# Patient Record
Sex: Female | Born: 1980 | Hispanic: Yes | Marital: Single | Smoking: Never smoker
Health system: Southern US, Community
[De-identification: ages and names within clinical notes are randomized; demographics above are authoritative.]

## PROBLEM LIST (undated history)

## (undated) DIAGNOSIS — Z789 Other specified health status: Secondary | ICD-10-CM

## (undated) HISTORY — PX: ABDOMINAL HYSTERECTOMY: SHX81

---

## 2011-05-16 ENCOUNTER — Emergency Department (HOSPITAL_COMMUNITY): Payer: Self-pay

## 2011-05-16 ENCOUNTER — Encounter (HOSPITAL_COMMUNITY): Payer: Self-pay | Admitting: Cardiology

## 2011-05-16 ENCOUNTER — Observation Stay (HOSPITAL_COMMUNITY)
Admission: EM | Admit: 2011-05-16 | Discharge: 2011-05-17 | Disposition: A | Discharge status: Home / Self Care | Payer: No Typology Code available for payment source | Attending: General Surgery | Admitting: General Surgery

## 2011-05-16 DIAGNOSIS — S060X0A Concussion without loss of consciousness, initial encounter: Secondary | ICD-10-CM | POA: Diagnosis present

## 2011-05-16 DIAGNOSIS — S0003XA Contusion of scalp, initial encounter: Secondary | ICD-10-CM

## 2011-05-16 DIAGNOSIS — S7000XA Contusion of unspecified hip, initial encounter: Secondary | ICD-10-CM | POA: Insufficient documentation

## 2011-05-16 DIAGNOSIS — S0083XA Contusion of other part of head, initial encounter: Secondary | ICD-10-CM

## 2011-05-16 DIAGNOSIS — S300XXA Contusion of lower back and pelvis, initial encounter: Secondary | ICD-10-CM

## 2011-05-16 DIAGNOSIS — R413 Other amnesia: Secondary | ICD-10-CM | POA: Insufficient documentation

## 2011-05-16 DIAGNOSIS — D649 Anemia, unspecified: Secondary | ICD-10-CM | POA: Insufficient documentation

## 2011-05-16 DIAGNOSIS — S060X9A Concussion with loss of consciousness of unspecified duration, initial encounter: Secondary | ICD-10-CM

## 2011-05-16 DIAGNOSIS — S7001XA Contusion of right hip, initial encounter: Secondary | ICD-10-CM | POA: Diagnosis present

## 2011-05-16 DIAGNOSIS — S060XAA Concussion with loss of consciousness status unknown, initial encounter: Secondary | ICD-10-CM

## 2011-05-16 DIAGNOSIS — Z23 Encounter for immunization: Secondary | ICD-10-CM | POA: Insufficient documentation

## 2011-05-16 DIAGNOSIS — Y998 Other external cause status: Secondary | ICD-10-CM | POA: Insufficient documentation

## 2011-05-16 DIAGNOSIS — S1093XA Contusion of unspecified part of neck, initial encounter: Secondary | ICD-10-CM

## 2011-05-16 DIAGNOSIS — T07XXXA Unspecified multiple injuries, initial encounter: Secondary | ICD-10-CM

## 2011-05-16 HISTORY — DX: Other specified health status: Z78.9

## 2011-05-16 LAB — DIFFERENTIAL
Basophils Relative: 0 % (ref 0–1)
Eosinophils Absolute: 0.1 10*3/uL (ref 0.0–0.7)
Monocytes Absolute: 0.6 10*3/uL (ref 0.1–1.0)
Monocytes Relative: 7 % (ref 3–12)
Neutrophils Relative %: 47 % (ref 43–77)

## 2011-05-16 LAB — CBC
HCT: 39 % (ref 36.0–46.0)
Hemoglobin: 12.5 g/dL (ref 12.0–15.0)
MCH: 25.3 pg — ABNORMAL LOW (ref 26.0–34.0)
MCHC: 32.1 g/dL (ref 30.0–36.0)

## 2011-05-16 LAB — POCT I-STAT, CHEM 8
Calcium, Ion: 1.13 mmol/L (ref 1.12–1.32)
Glucose, Bld: 89 mg/dL (ref 70–99)
HCT: 41 % (ref 36.0–46.0)
Hemoglobin: 13.9 g/dL (ref 12.0–15.0)

## 2011-05-16 LAB — BASIC METABOLIC PANEL
BUN: 8 mg/dL (ref 6–23)
Calcium: 9.4 mg/dL (ref 8.4–10.5)
Creatinine, Ser: 0.67 mg/dL (ref 0.50–1.10)
GFR calc Af Amer: >90 mL/min (ref >90)
GFR calc non Af Amer: >90 mL/min (ref >90)

## 2011-05-16 LAB — URINALYSIS, ROUTINE W REFLEX MICROSCOPIC
Bilirubin Urine: NEGATIVE
Ketones, ur: 40 mg/dL — AB
Nitrite: NEGATIVE
Urobilinogen, UA: 0.2 mg/dL (ref 0.0–1.0)

## 2011-05-16 MED ORDER — SODIUM CHLORIDE 0.9 % IV SOLN
INTRAVENOUS | Status: DC
  Administered 2011-05-16 – 2011-05-17 (×2): via INTRAVENOUS
  Filled 2011-05-16 (×4): qty 1000

## 2011-05-16 MED ORDER — HYDROCODONE-ACETAMINOPHEN 5-325 MG PO TABS
2.0000 | ORAL_TABLET | ORAL | Status: DC | PRN
  Administered 2011-05-16 – 2011-05-17 (×2): 2 via ORAL
  Filled 2011-05-16 (×2): qty 2

## 2011-05-16 MED ORDER — KETOROLAC TROMETHAMINE 15 MG/ML IJ SOLN
30.0000 mg | INTRAMUSCULAR | Status: AC
  Filled 2011-05-16: qty 2

## 2011-05-16 MED ORDER — MORPHINE SULFATE 4 MG/ML IJ SOLN
4.0000 mg | Freq: Once | INTRAMUSCULAR | Status: AC
  Administered 2011-05-16: 4 mg via INTRAVENOUS

## 2011-05-16 MED ORDER — ONDANSETRON HCL 4 MG/2ML IJ SOLN
4.0000 mg | Freq: Once | INTRAMUSCULAR | Status: AC
  Administered 2011-05-16: 4 mg via INTRAVENOUS
  Filled 2011-05-16: qty 2

## 2011-05-16 MED ORDER — ONDANSETRON HCL 8 MG PO TABS
4.0000 mg | ORAL_TABLET | Freq: Four times a day (QID) | ORAL | Status: DC | PRN
  Filled 2011-05-16: qty 0.5

## 2011-05-16 MED ORDER — ONDANSETRON HCL 4 MG/2ML IJ SOLN
4.0000 mg | Freq: Four times a day (QID) | INTRAMUSCULAR | Status: DC | PRN
  Administered 2011-05-16: 4 mg via INTRAVENOUS

## 2011-05-16 MED ORDER — ACETAMINOPHEN 325 MG PO TABS: 650.0000 mg | ORAL_TABLET | ORAL | Status: DC | PRN

## 2011-05-16 MED ORDER — HYDROMORPHONE HCL PF 1 MG/ML IJ SOLN
1.0000 mg | Freq: Once | INTRAMUSCULAR | Status: AC
  Administered 2011-05-16: 1 mg via INTRAVENOUS
  Filled 2011-05-16: qty 1

## 2011-05-16 MED ORDER — SODIUM CHLORIDE 0.9 % IV SOLN
Freq: Once | INTRAVENOUS | Status: AC
  Administered 2011-05-16: 15:00:00 via INTRAVENOUS

## 2011-05-16 MED ORDER — MORPHINE SULFATE 2 MG/ML IJ SOLN
INTRAMUSCULAR | Status: AC | PRN
  Administered 2011-05-16: 4 mg via INTRAVENOUS

## 2011-05-16 MED ORDER — ONDANSETRON HCL 4 MG/2ML IJ SOLN
4.0000 mg | Freq: Once | INTRAMUSCULAR | Status: AC
  Administered 2011-05-16: 4 mg via INTRAVENOUS

## 2011-05-16 MED ORDER — MORPHINE SULFATE 4 MG/ML IJ SOLN
4.0000 mg | Freq: Once | INTRAMUSCULAR | Status: AC
  Administered 2011-05-16: 4 mg via INTRAVENOUS
  Filled 2011-05-16: qty 1

## 2011-05-16 MED ORDER — KETOROLAC TROMETHAMINE 15 MG/ML IJ SOLN
15.0000 mg | Freq: Four times a day (QID) | INTRAMUSCULAR | Status: DC | PRN
  Filled 2011-05-16: qty 1

## 2011-05-16 MED ORDER — FENTANYL CITRATE 0.05 MG/ML IJ SOLN: 25.0000 ug | INTRAMUSCULAR | Status: DC | PRN
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
## 2011-05-16 NOTE — ED Notes (Signed)
Family updated as to patient's status. Friend/co-worker was informed of pt's status, he is en route to our facility, Immunologist made calls and updated pt. There is no contact number for family at this time, pt does not remember any phone numbers and cell phone was not with pt upon arrival to ED

## 2011-05-16 NOTE — ED Notes (Signed)
Pt reports she is unable to give a ua at this time

## 2011-05-16 NOTE — ED Notes (Signed)
Pt transported to CT w/Murrell Elizondo RN
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
## 2011-05-16 NOTE — ED Notes (Signed)
Pt undressed and in a gown. Cardiac monitor, pulse oximetry, and blood pressure cuff on. 

## 2011-05-16 NOTE — ED Notes (Signed)
[id number] Ready 

## 2011-05-16 NOTE — ED Notes (Signed)
Pt still unable to obtain a UA for [place] at this time.  Pt extremely dizzy
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
## 2011-05-16 NOTE — ED Notes (Signed)
Pt returned from CT/XR 

## 2011-05-16 NOTE — Progress Notes (Signed)
Pt had a BP of 89/61 at 1900, Trauma MD notified, patient's rate of fluid changed to 125cc/hr. Overall pt is stable.

## 2011-05-17 ENCOUNTER — Observation Stay (HOSPITAL_COMMUNITY): Payer: No Typology Code available for payment source

## 2011-05-17 ENCOUNTER — Encounter (HOSPITAL_COMMUNITY): Payer: Self-pay | Admitting: Radiology

## 2011-05-17 LAB — CBC
HCT: 33.9 % — ABNORMAL LOW (ref 36.0–46.0)
Hemoglobin: 10.9 g/dL — ABNORMAL LOW (ref 12.0–15.0)
MCHC: 32.2 g/dL (ref 30.0–36.0)
RBC: 4.29 MIL/uL (ref 3.87–5.11)
WBC: 13 10*3/uL — ABNORMAL HIGH (ref 4.0–10.5)

## 2011-05-17 LAB — BASIC METABOLIC PANEL
BUN: 5 mg/dL — ABNORMAL LOW (ref 6–23)
Chloride: 107 mEq/L (ref 96–112)
GFR calc Af Amer: >90 mL/min (ref >90)
GFR calc non Af Amer: >90 mL/min (ref >90)
Potassium: 3.2 mEq/L — ABNORMAL LOW (ref 3.5–5.1)
Sodium: 138 mEq/L (ref 135–145)

## 2011-05-17 MED ORDER — INFLUENZA VIRUS VACC SPLIT PF IM SUSP
0.5000 mL | Freq: Once | INTRAMUSCULAR | Status: AC
  Administered 2011-05-17: 0.5 mL via INTRAMUSCULAR
  Filled 2011-05-17: qty 0.5

## 2011-05-17 MED ORDER — IOHEXOL 300 MG/ML  SOLN
100.0000 mL | Freq: Once | INTRAMUSCULAR | Status: AC | PRN
  Administered 2011-05-17: 100 mL via INTRAVENOUS

## 2011-05-17 MED ORDER — ACETAMINOPHEN 325 MG PO TABS: 650.0000 mg | ORAL_TABLET | ORAL | Status: AC | PRN

## 2011-05-17 MED ORDER — HYDROCODONE-ACETAMINOPHEN 5-325 MG PO TABS: 2.0000 | ORAL_TABLET | ORAL | Status: AC | PRN
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
## 2011-05-17 NOTE — Progress Notes (Signed)
Paged MD (rosenbower) concerning BP of 83/47. Orders to get a CBC. Will continue to monitor
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
## 2011-05-17 NOTE — Progress Notes (Signed)
UR of chart completed. Await PT/OT evals to determine if pt will have HH Needs.

## 2011-05-17 NOTE — Plan of Care (Signed)
Problem: Phase II Progression Outcomes Goal: Other Phase II Outcomes/Goals Cognitive assessment completed.  No ST services indicated.  See report for details.  Breck Coons [place].Ed [employer] [phone]  05/17/2011 \

## 2011-05-17 NOTE — Discharge Instructions (Signed)
Concussion and Brain Injury     A blow or jolt to the head can disrupt the normal function of the brain. This type of brain injury is often called a "concussion" or a "closed head injury." Concussions are usually not life-threatening. Even so, the effects of a concussion can be serious.   CAUSES   A concussion is caused by a blunt blow to the head. The blow might be direct or indirect as described below.  · Direct blow (running into another player during a soccer game, being hit in a fight, or hitting your head on a hard surface).   · Indirect blow (when your head moves rapidly and violently back and forth like in a car crash).   SYMPTOMS   The brain is very complex. Every head injury is different. Some symptoms may appear right away. Other symptoms may not show up for days or weeks after the concussion. The signs of concussion can be hard to notice. Early on, problems may be missed by patients, family members, and caregivers. You may look fine even though you are acting or feeling differently.   These symptoms are usually temporary, but may last for days, weeks, or even longer. Symptoms include:  · Mild headaches that will not go away.   · Having more trouble than usual with:   · Remembering things.   · Paying attention or concentrating.   · Organizing daily tasks.   · Making decisions and solving problems.   · Slowness in thinking, acting, speaking, or reading.   · Getting lost or easily confused.   · Feeling tired all the time or lacking energy (fatigue).   · Feeling drowsy.   · Sleep disturbances.   · Sleeping more than usual.   · Sleeping less than usual.   · Trouble falling asleep.   · Trouble sleeping (insomnia).   · Loss of balance or feeling lightheaded or dizzy.   · Nausea or vomiting.   · Numbness or tingling.   · Increased sensitivity to:   · Sounds.   · Lights.   · Distractions.   Other symptoms might include:  · Vision problems or eyes that tire easily.   · Diminished sense of taste or smell.    · Ringing in the ears.   · Mood changes such as feeling sad, anxious, or listless.   · Becoming easily irritated or angry for little or no reason.   · Lack of motivation.   DIAGNOSIS   Your caregiver can usually diagnose a concussion or mild brain injury based on your description of your injury and your symptoms.   Your evaluation might include:  · A brain scan to look for signs of injury to the brain. Even if the test shows no injury, you may still have a concussion.   · Blood tests to be sure other problems are not present.   TREATMENT   · People with a concussion need to be examined and evaluated. Most people with concussions are treated in an emergency department, urgent care, or clinic. Some people must stay in the hospital overnight for further treatment.   · Your caregiver will send you home with important instructions to follow. Be sure to carefully follow them.   · Tell your caregiver if you are already taking any medicines (prescription, over-the-counter, or natural remedies), or if you are drinking alcohol or taking illegal drugs. Also, talk with your caregiver if you are taking blood thinners (anticoagulants) or aspirin. These drugs   may increase your chances of complications. All of this is important information that may affect treatment.   · Only take over-the-counter or prescription medicines for pain, discomfort, or fever as directed by your caregiver.   PROGNOSIS   How fast people recover from brain injury varies from person to person. Although most people have a good recovery, how quickly they improve depends on many factors. These factors include how severe their concussion was, what part of the brain was injured, their age, and how healthy they were before the concussion.   Because all head injuries are different, so is recovery. Most people with mild injuries recover fully. Recovery can take time. In general, recovery is slower in older persons. Also, persons who have had a concussion in the  past or have other medical problems may find that it takes longer to recover from their current injury. Anxiety and depression may also make it harder to adjust to the symptoms of brain injury.  HOME CARE INSTRUCTIONS   Return to your normal activities slowly, not all at once. You must give your body and brain enough time for recovery.  · Get plenty of sleep at night, and rest during the day. Rest helps the brain to heal.   · Avoid staying up late at night.   · Keep the same bedtime hours on weekends and weekdays.   · Take daytime naps or rest breaks when you feel tired.   · Limit activities that require a lot of thought or concentration (brain or cognitive rest). This includes:   · Homework or job-related work.   · Watching TV.   · Computer work.   · Avoid activities that could lead to a second brain injury, such as contact or recreational sports, until your caregiver says it is okay. Even after your brain injury has healed, you should protect yourself from having another concussion.   · Ask your caregiver when you can return to your normal activities such as driving, bicycling, or operating heavy equipment. Your ability to react may be slower after a brain injury.   · Talk with your caregiver about when you can return to work or school.   · Inform your teachers, school nurse, school counselor, coach, athletic trainer, or work manager about your injury, symptoms, and restrictions. They should be instructed to report:   · Increased problems with attention or concentration.   · Increased problems remembering or learning new information.   · Increased time needed to complete tasks or assignments.   · Increased irritability or decreased ability to cope with stress.   · Increased symptoms.   · Take only those medicines that your caregiver has approved.   · Do not drink alcohol until your caregiver says you are well enough to do so. Alcohol and certain other drugs may slow your recovery and can put you at risk of further  injury.   · If it is harder than usual to remember things, write them down.   · If you are easily distracted, try to do one thing at a time. For example, do not try to watch TV while fixing dinner.   · Talk with family members or close friends when making important decisions.   · Keep all follow-up appointments. Repeated evaluation of your symptoms is recommended for your recovery.   PREVENTION   Protect your head from future injury. It is very important to avoid another head or brain injury before you have recovered. In rare cases, another injury   has lead to permanent brain damage, brain swelling, or death. Avoid injuries by using:  · Seatbelts when riding in a car.   · Alcohol only in moderation.   · A helmet when biking, skiing, skateboarding, skating, or doing similar activities.   · Safety measures in your home.   · Remove clutter and tripping hazards from floors and stairways.   · Use grab bars in bathrooms and handrails by stairs.   · Place non-slip mats on floors and in bathtubs.   · Improve lighting in dim areas.   SEEK MEDICAL CARE IF:   A head injury can cause lingering symptoms. You should seek medical care if you have any of the following symptoms for more than 3 weeks after your injury or are planning to return to sports:  · Chronic headaches.   · Dizziness or balance problems.   · Nausea.   · Vision problems.   · Increased sensitivity to noise or light.   · Depression or mood swings.   · Anxiety or irritability.   · Memory problems.   · Difficulty concentrating or paying attention.   · Sleep problems.   · Feeling tired all the time.   SEEK IMMEDIATE MEDICAL CARE IF:   You have had a blow or jolt to the head and you (or your family or friends) notice:  · Severe or worsening headaches.   · Weakness (even if only in one hand or one leg or one part of the face), numbness, or decreased coordination.   · Repeated vomiting.   · Increased sleepiness or passing out.   · One black center of the eye (pupil) is  larger than the other.   · Convulsions (seizures).   · Slurred speech.   · Increasing confusion, restlessness, agitation, or irritability.   · Lack of ability to recognize people or places.   · Neck pain.   · Difficulty being awakened.   · Unusual behavior changes.   · Loss of consciousness.   Older adults with a brain injury may have a higher risk of serious complications such as a blood clot on the brain. Headaches that get worse or an increase in confusion are signs of this complication. If these signs occur, see a caregiver right away.  MAKE SURE YOU:   · Understand these instructions.   · Will watch your condition.   · Will get help right away if you are not doing well or get worse.   FOR MORE INFORMATION   Several groups help people with brain injury and their families. They provide information and put people in touch with local resources. These include support groups, rehabilitation services, and a variety of health care professionals. Among these groups, the Brain Injury Association (BIA, www.biausa.org) has a national office that gathers scientific and educational information and works on a national level to help people with brain injury.   Document Released: 06/11/2003 Document Revised: 12/01/2010 Document Reviewed: 11/07/2007  ExitCare® Patient Information ©2012 [employer].
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
## 2011-05-17 NOTE — Progress Notes (Signed)
PT NOTE: Pt mobilizing at modified independent level, no further PT needs discovered. PT Signing off. Thanks!  Carri Spillers (Beverely Pace) Carleene Mains PT, DPT Acute Rehabilitation [phone]

## 2011-05-17 NOTE — Discharge Summary (Signed)
Shannon Librizzi, MD, MPH, FACS Pager: [phone]  
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
## 2017-02-08 ENCOUNTER — Other Ambulatory Visit: Payer: Self-pay

## 2017-02-08 ENCOUNTER — Ambulatory Visit: Payer: Self-pay | Admitting: Emergency Medicine

## 2017-02-08 ENCOUNTER — Ambulatory Visit (INDEPENDENT_AMBULATORY_CARE_PROVIDER_SITE_OTHER): Payer: Self-pay

## 2017-02-08 ENCOUNTER — Encounter: Payer: Self-pay | Admitting: Emergency Medicine

## 2017-02-08 VITALS — BP 104/66 | HR 68 | Temp 97.9°F | Resp 16 | Ht 63.5 in | Wt 158.0 lb

## 2017-02-08 DIAGNOSIS — S99911A Unspecified injury of right ankle, initial encounter: Secondary | ICD-10-CM

## 2017-02-08 DIAGNOSIS — S93401A Sprain of unspecified ligament of right ankle, initial encounter: Secondary | ICD-10-CM

## 2017-02-08 DIAGNOSIS — M25571 Pain in right ankle and joints of right foot: Secondary | ICD-10-CM

## 2017-02-08 NOTE — Patient Instructions (Addendum)
IF you received an x-ray today, you will receive an invoice from [facility]. Please contact [facility] at [phone] with questions or concerns regarding your invoice.   IF you received labwork today, you will receive an invoice from [facility]. Please contact [facility] at [phone] with questions or concerns regarding your invoice.   Our billing staff will not be able to assist you with questions regarding bills from these companies.  You will be contacted with the lab results as soon as they are available. The fastest way to get your results is to activate your My Chart account. Instructions are located on the last page of this paperwork. If you have not heard from us regarding the results in 2 weeks, please contact this office.     Esguince de tobillo (Ankle Sprain) Un esguince de tobillo es una distensin o un desgarro en uno de los tejidos resistentes (ligamentos) del tobillo. CUIDADOS EN EL HOGAR  Mantenga el tobillo en reposo.  CenterPoint Energyome los medicamentos de venta libre y los recetados solamente como se lo haya indicado el mdico.  Durante 2 o 3 das, mantenga el tobillo por encima del nivel del corazn (elevado) tanto como sea posible.  Si se lo indican, aplique hielo sobre la zona: ? Ponga el hielo en una bolsa plstica. ? Coloque una FirstEnergy Corptoalla entre la piel y la bolsa de hielo. ? Coloque el hielo durante 20minutos, 2 a 3veces por da.  Si le pusieron un dispositivo ortopdico: ? selos como se lo hayan indicado. ? Slo debe quitarlo para ducharse o baarse. ? Trate de no mover mucho el tobillo, pero mueva los dedos de vez en cuando. Esto ayuda a evitar la hinchazn.  Si le pusieron una venda elstica (vendaje): ? Qutesela para ducharse o baarse. ? Trate de no mover mucho el tobillo, pero mueva los dedos de vez en cuando. Esto ayuda a evitar la hinchazn. ? Si siente que la venda est muy ajustada, puede aflojarla un poco para estar ms  cmodo. ? Afloje la venda si pierde la sensibilidad en el pie, si tiene hormigueo en el pie o si el pie est fro y de Edison Internationalcolor azul.  Si tiene Murphy Oilmuletas, selas como se lo haya indicado el mdico. Siga usndolas hasta que pueda caminar sin sentir dolor en el tobillo. SOLICITE AYUDA SI:  Los hematomas o la hinchazn empeoran rpidamente.  El dolor no mejora despus de tomar medicamentos. SOLICITE AYUDA DE INMEDIATO SI:  No puede sentir el pie o los dedos del pie.  El pie o los dedos del pie estn Jenkinsazulados.  Siente un dolor muy intenso que Wickliffeempeora. Esta informacin no tiene Theme park managercomo fin reemplazar el consejo del mdico. Asegrese de hacerle al mdico cualquier pregunta que tenga. Document Released: 12/14/2011 Document Revised: 07/13/2015 Document Reviewed: 10/21/2014 Elsevier Interactive Patient Education  2018 Elsevier Inc.  Ankle Sprain An ankle sprain is a stretch or tear in one of the tough tissues (ligaments) in your ankle. Follow these instructions at home:  Rest your ankle.  Take over-the-counter and prescription medicines only as told by your doctor.  For 2-3 days, keep your ankle higher than the level of your heart (elevated) as much as possible.  If directed, put ice on the area: ? Put ice in a plastic bag. ? Place a towel between your skin and the bag. ? Leave the ice on for 20 minutes, 2-3 times a day.  If you were given a brace: ? Wear it as told. ? Take  it off to shower or bathe. ? Try not to move your ankle much, but wiggle your toes from time to time. This helps to prevent swelling.  If you were given an elastic bandage (dressing): ? Take it off when you shower or bathe. ? Try not to move your ankle much, but wiggle your toes from time to time. This helps to prevent swelling. ? Adjust the bandage to make it more comfortable if it feels too tight. ? Loosen the bandage if you lose feeling in your foot, your foot tingles, or your foot gets cold and blue.  If you have  crutches, use them as told by your doctor. Continue to use them until you can walk without feeling pain in your ankle. Contact a doctor if:  Your bruises or swelling are quickly getting worse.  Your pain does not get better after you take medicine. Get help right away if:  You cannot feel your toes or foot.  Your toes or your foot looks blue.  You have very bad pain that gets worse. This information is not intended to replace advice given to you by your health care provider. Make sure you discuss any questions you have with your health care provider. Document Released: 09/07/2007 Document Revised: 08/27/2015 Document Reviewed: 10/21/2014 Elsevier Interactive Patient Education  [employer]2018 Elsevier Inc.

## 2017-02-08 NOTE — Progress Notes (Signed)
Shannon NonesCynthia Castro 36 y.o.   Chief Complaint  Patient presents with  . Ankle Injury    RIGHT  this morning    HISTORY OF PRESENT ILLNESS: This is a 36 y.o. female complaining of injury to right ankle with swelling and pain.  HPI   Prior to Admission medications   Medication Sig Start Date End Date Taking? Authorizing Provider  Multiple Vitamin (MULTIVITAMIN) tablet Take 1 tablet daily by mouth.   Yes [provider]    No Known Allergies  Patient Active Problem List   Diagnosis Date Noted  . Concussion with mental confusion or disorientation without loss of consciousness 05/16/2011  . Contusion of right hip 05/16/2011    Past Medical History:  Diagnosis Date  . No pertinent past medical history     Past Surgical History:  Procedure Laterality Date  . ABDOMINAL HYSTERECTOMY    . CESAREAN SECTION      Social History   Socioeconomic History  . Marital status: Single    Spouse name: Not on file  . Number of children: Not on file  . Years of education: Not on file  . Highest education level: Not on file  Social Needs  . Financial resource strain: Not on file  . Food insecurity - worry: Not on file  . Food insecurity - inability: Not on file  . Transportation needs - medical: Not on file  . Transportation needs - non-medical: Not on file  Occupational History  . Not on file  Tobacco Use  . Smoking status: Never Smoker  . Smokeless tobacco: Never Used  Substance and Sexual Activity  . Alcohol use: No  . Drug use: No  . Sexual activity: Yes  Other Topics Concern  . Not on file  Social History Narrative  . Not on file    No family history on file.   Review of Systems  Constitutional: Negative.  Negative for chills and fever.  Respiratory: Negative for shortness of breath.   Gastrointestinal: Negative for nausea and vomiting.  Musculoskeletal: Positive for joint pain (right ankle).  Neurological: Negative for dizziness and headaches.  All  other systems reviewed and are negative.  Vitals:   02/08/17 1329  BP: 104/66  Pulse: 68  Resp: 16  Temp: 97.9 F (36.6 C)  SpO2: 99%     Physical Exam  Constitutional: She is oriented to person, place, and time. She appears well-developed and well-nourished.  HENT:  Head: Normocephalic and atraumatic.  Eyes: EOM are normal. Pupils are equal, round, and reactive to light.  Neck: Normal range of motion.  Cardiovascular: Normal rate.  Pulmonary/Chest: Effort normal.  Musculoskeletal:  Right ankle: +lateral swelling with tenderness and LROM; NVI  Neurological: She is alert and oriented to person, place, and time.  Skin: Skin is warm and dry. Capillary refill takes less than 2 seconds.  Psychiatric: She has a normal mood and affect. Her behavior is normal.  Vitals reviewed.  Dg Ankle Complete Right  Result Date: 02/08/2017 CLINICAL DATA:  Right ankle pain and swelling following a twisting injury this morning. EXAM: RIGHT ANKLE - COMPLETE 3+ VIEW COMPARISON:  None. FINDINGS: Lateral and anterior soft tissue swelling. No fracture, dislocation or effusion. IMPRESSION: No fracture. Electronically Signed   By: Beckie SaltsSteven  Reid M.D.   On: 02/08/2017 14:08     ASSESSMENT & PLAN:  Aram BeechamCynthia was seen today for ankle injury.  Diagnoses and all orders for this visit:  Injury of right ankle, initial encounter -  DG Ankle Complete Right; Future -     Apply ankle splint air cast  Acute right ankle pain -     Apply ankle splint air cast  Sprain of right ankle, unspecified ligament, initial encounter    Patient Instructions       IF you received an x-ray today, you will receive an invoice from [facility]. Please contact [facility] at [phone] with questions or concerns regarding your invoice.   IF you received labwork today, you will receive an invoice from [facility]. Please contact [facility] at [phone] with questions or concerns regarding your invoice.     Our billing staff will not be able to assist you with questions regarding bills from these companies.  You will be contacted with the lab results as soon as they are available. The fastest way to get your results is to activate your My Chart account. Instructions are located on the last page of this paperwork. If you have not heard from [place] regarding the results in 2 weeks, please contact this office.     Esguince de tobillo (Ankle Sprain) Un esguince de tobillo es una distensin o un desgarro en uno de los tejidos resistentes (ligamentos) del tobillo. CUIDADOS EN EL HOGAR  Mantenga el tobillo en reposo.  [employer] medicamentos de venta libre y los recetados solamente como se lo haya indicado el mdico.  Durante 2 o 3 das, mantenga el tobillo por encima del nivel del corazn (elevado) tanto como sea posible.  Si se lo indican, aplique hielo sobre la zona: ? Ponga el hielo en una bolsa plstica. ? Coloque una [employer] piel y la bolsa de hielo. ? Coloque el hielo durante , 2 a 3veces por da.  Si le pusieron un dispositivo ortopdico: ? selos como se lo hayan indicado. ? Slo debe quitarlo para ducharse o baarse. ? Trate de no mover mucho el tobillo, pero mueva los dedos de vez en cuando. Esto ayuda a evitar la hinchazn.  Si le pusieron una venda elstica (vendaje): ? Qutesela para ducharse o baarse. ? Trate de no mover mucho el tobillo, pero mueva los dedos de vez en cuando. Esto ayuda a evitar la hinchazn. ? Si siente que la venda est muy ajustada, puede aflojarla un poco para estar ms cmodo. ? Afloje la venda si pierde la sensibilidad en el pie, si tiene hormigueo en el pie o si el pie est fro y de [employer].  Si tiene [employer], selas como se lo haya indicado el mdico. Siga usndolas hasta que pueda caminar sin sentir dolor en el tobillo. SOLICITE AYUDA SI:  Los hematomas o la hinchazn empeoran rpidamente.  El dolor no mejora despus de tomar  medicamentos. SOLICITE AYUDA DE INMEDIATO SI:  No puede sentir el pie o los dedos del pie.  El pie o los dedos del pie estn [place].  Siente un dolor muy intenso que [place]. Esta informacin no tiene Theme park manager el consejo del mdico. Asegrese de hacerle al mdico cualquier pregunta que tenga. Document Released: 12/14/2011 Document Revised: 07/13/2015 Document Reviewed: 10/21/2014 Elsevier Interactive Patient Education  2018 Elsevier Inc.  Ankle Sprain An ankle sprain is a stretch or tear in one of the tough tissues (ligaments) in your ankle. Follow these instructions at home:  Rest your ankle.  Take over-the-counter and prescription medicines only as told by your doctor.  For 2-3 days, keep your ankle higher than the level of your heart (elevated) as much as possible.  If  directed, put ice on the area: ? Put ice in a plastic bag. ? Place a towel between your skin and the bag. ? Leave the ice on for 20 minutes, 2-3 times a day.  If you were given a brace: ? Wear it as told. ? Take it off to shower or bathe. ? Try not to move your ankle much, but wiggle your toes from time to time. This helps to prevent swelling.  If you were given an elastic bandage (dressing): ? Take it off when you shower or bathe. ? Try not to move your ankle much, but wiggle your toes from time to time. This helps to prevent swelling. ? Adjust the bandage to make it more comfortable if it feels too tight. ? Loosen the bandage if you lose feeling in your foot, your foot tingles, or your foot gets cold and blue.  If you have crutches, use them as told by your doctor. Continue to use them until you can walk without feeling pain in your ankle. Contact a doctor if:  Your bruises or swelling are quickly getting worse.  Your pain does not get better after you take medicine. Get help right away if:  You cannot feel your toes or foot.  Your toes or your foot looks blue.  You have very bad  pain that gets worse. This information is not intended to replace advice given to you by your health care provider. Make sure you discuss any questions you have with your health care provider. Document Released: 09/07/2007 Document Revised: 08/27/2015 Document Reviewed: 10/21/2014 Elsevier Interactive Patient Education  2018 Elsevier Inc.     Edwina BarthMiguel Kemiyah Tarazon, MD [facility]
